# Patient Record
Sex: Male | Born: 1975 | Race: White | Hispanic: Yes | Marital: Married | Smoking: Smoker, current status unknown
Health system: Southern US, Community
[De-identification: ages and names within clinical notes are randomized; demographics above are authoritative.]

## PROBLEM LIST (undated history)

## (undated) DIAGNOSIS — K219 Gastro-esophageal reflux disease without esophagitis: Secondary | ICD-10-CM

## (undated) HISTORY — DX: Gastro-esophageal reflux disease without esophagitis: K21.9

---

## 2004-01-17 ENCOUNTER — Emergency Department (HOSPITAL_COMMUNITY): Admission: EM | Admit: 2004-01-17 | Discharge: 2004-01-17 | Payer: Self-pay | Admitting: Emergency Medicine

## 2007-01-10 ENCOUNTER — Ambulatory Visit: Payer: Self-pay | Admitting: Internal Medicine

## 2007-01-10 DIAGNOSIS — K5909 Other constipation: Secondary | ICD-10-CM

## 2007-01-10 DIAGNOSIS — K219 Gastro-esophageal reflux disease without esophagitis: Secondary | ICD-10-CM

## 2007-01-13 LAB — CONVERTED CEMR LAB
Basophils Absolute: 0 10*3/uL (ref 0.0–0.1)
Basophils Relative: 0 % (ref 0–1)
Cholesterol: 240 mg/dL — ABNORMAL HIGH (ref 0–200)
Eosinophils Absolute: 0.1 10*3/uL (ref 0.0–0.7)
MCHC: 34.4 g/dL (ref 30.0–36.0)
MCV: 79.3 fL (ref 78.0–100.0)
Neutro Abs: 3.1 10*3/uL (ref 1.7–7.7)
Neutrophils Relative %: 53 % (ref 43–77)
Platelets: 184 10*3/uL (ref 150–400)
Potassium: 4.1 meq/L (ref 3.5–5.3)
Sodium: 140 meq/L (ref 135–145)
TSH: 1.518 microintl units/mL (ref 0.350–5.50)

## 2007-01-14 ENCOUNTER — Telehealth (INDEPENDENT_AMBULATORY_CARE_PROVIDER_SITE_OTHER): Payer: Self-pay | Admitting: *Deleted

## 2007-02-05 ENCOUNTER — Ambulatory Visit: Payer: Self-pay | Admitting: Internal Medicine

## 2007-02-25 ENCOUNTER — Telehealth (INDEPENDENT_AMBULATORY_CARE_PROVIDER_SITE_OTHER): Payer: Self-pay | Admitting: *Deleted

## 2007-08-21 ENCOUNTER — Telehealth (INDEPENDENT_AMBULATORY_CARE_PROVIDER_SITE_OTHER): Payer: Self-pay | Admitting: *Deleted

## 2012-01-14 ENCOUNTER — Ambulatory Visit (INDEPENDENT_AMBULATORY_CARE_PROVIDER_SITE_OTHER): Payer: Self-pay | Admitting: Internal Medicine

## 2012-01-14 ENCOUNTER — Encounter: Payer: Self-pay | Admitting: Internal Medicine

## 2012-01-14 VITALS — BP 112/76 | HR 57 | Temp 98.6°F | Ht 66.0 in | Wt 175.6 lb

## 2012-01-14 DIAGNOSIS — Z0000 Encounter for general adult medical examination without abnormal findings: Secondary | ICD-10-CM

## 2012-01-14 NOTE — Assessment & Plan Note (Signed)
Td 2008 Declined flu shot We discussed diet, exercise, self testicular exam. Labs

## 2012-01-14 NOTE — Patient Instructions (Signed)
Please come back fasting: FLP, CMP, TSH, CBC-- dx V70

## 2012-01-14 NOTE — Progress Notes (Signed)
  Subjective:    Patient ID: Sean Wang, male    DOB: 1975/06/19, 36 y.o.   MRN: [id number]  HPI New patient, CPX  Past Medical History: GERD Chronic constipation  Past surgical history No  Family History: Father:living--stroke (4 y/o) Mother: living DM-- aunt CAD-- no prostate ca--no colon ca--no   Social History: Married, 2 son,  from [place] Tobacco -- ~1/6 ppd Drug use:  no Alcohol use:  Yes, socially Occupation-- car detail, painting  Diet-- regular  Exercise-- nothing regular but active at work  Review of Systems No chest pain or shortness of breath No nausea, vomiting, diarrhea. Occasionally has GERD. Has chronic constipation, currently well managed with fiber daily. No dysuria gross nocturia. No anxiety or depression. No headaches.    Objective:   Physical Exam General -- alert, well-developed, and well-nourished.   Neck --no thyromegaly  Lungs -- normal respiratory effort, no intercostal retractions, no accessory muscle use, and normal breath sounds.   Heart-- normal rate, regular rhythm, no murmur, and no gallop.   Abdomen--soft, non-tender, no distention, no masses, no HSM, no guarding, and no rigidity.   Extremities-- no pretibial edema bilaterally  Neurologic-- alert & oriented X3 and strength normal in all extremities. Psych-- Cognition and judgment appear intact. Alert and cooperative with normal attention span and concentration.  not anxious appearing and not depressed appearing.      Assessment & Plan:

## 2012-01-15 ENCOUNTER — Other Ambulatory Visit (INDEPENDENT_AMBULATORY_CARE_PROVIDER_SITE_OTHER): Payer: Self-pay

## 2012-01-15 DIAGNOSIS — Z0000 Encounter for general adult medical examination without abnormal findings: Secondary | ICD-10-CM

## 2012-01-15 LAB — CBC WITH DIFFERENTIAL/PLATELET
Eosinophils Relative: 1 % (ref 0.0–5.0)
HCT: 46.7 % (ref 39.0–52.0)
Lymphs Abs: 1.6 10*3/uL (ref 0.7–4.0)
Monocytes Relative: 8.2 % (ref 3.0–12.0)
Platelets: 216 10*3/uL (ref 150.0–400.0)
RBC: 5.79 Mil/uL (ref 4.22–5.81)
WBC: 4.2 10*3/uL — ABNORMAL LOW (ref 4.5–10.5)

## 2012-01-15 LAB — LIPID PANEL
Cholesterol: 253 mg/dL — ABNORMAL HIGH (ref 0–200)
HDL: 36.8 mg/dL — ABNORMAL LOW (ref >39.00)
Triglycerides: 160 mg/dL — ABNORMAL HIGH (ref 0.0–149.0)

## 2012-01-15 LAB — COMPREHENSIVE METABOLIC PANEL
Albumin: 4 g/dL (ref 3.5–5.2)
Alkaline Phosphatase: 52 U/L (ref 39–117)
BUN: 12 mg/dL (ref 6–23)
Creatinine, Ser: 0.8 mg/dL (ref 0.4–1.5)
Glucose, Bld: 87 mg/dL (ref 70–99)
Potassium: 4.2 mEq/L (ref 3.5–5.1)
Total Bilirubin: 0.6 mg/dL (ref 0.3–1.2)

## 2012-01-16 LAB — LDL CHOLESTEROL, DIRECT: Direct LDL: 187 mg/dL

## 2012-01-17 LAB — TSH: TSH: 1.66 u[IU]/mL (ref 0.35–5.50)

## 2012-05-10 ENCOUNTER — Other Ambulatory Visit: Payer: Self-pay

## 2013-01-29 ENCOUNTER — Other Ambulatory Visit: Payer: Self-pay

## 2014-01-08 ENCOUNTER — Other Ambulatory Visit: Payer: Self-pay

## 2018-09-12 ENCOUNTER — Emergency Department (HOSPITAL_COMMUNITY)
Admission: EM | Admit: 2018-09-12 | Discharge: 2018-09-12 | Disposition: A | Discharge status: Home / Self Care | Payer: Self-pay | Attending: Emergency Medicine | Admitting: Emergency Medicine

## 2018-09-12 ENCOUNTER — Other Ambulatory Visit: Payer: Self-pay

## 2018-09-12 ENCOUNTER — Emergency Department (HOSPITAL_COMMUNITY): Payer: Self-pay

## 2018-09-12 DIAGNOSIS — R109 Unspecified abdominal pain: Secondary | ICD-10-CM

## 2018-09-12 DIAGNOSIS — R9389 Abnormal findings on diagnostic imaging of other specified body structures: Secondary | ICD-10-CM

## 2018-09-12 DIAGNOSIS — F17210 Nicotine dependence, cigarettes, uncomplicated: Secondary | ICD-10-CM | POA: Insufficient documentation

## 2018-09-12 DIAGNOSIS — R1031 Right lower quadrant pain: Secondary | ICD-10-CM | POA: Insufficient documentation

## 2018-09-12 LAB — URINALYSIS, ROUTINE W REFLEX MICROSCOPIC
Bilirubin Urine: NEGATIVE
Glucose, UA: NEGATIVE mg/dL
Hgb urine dipstick: NEGATIVE
Ketones, ur: NEGATIVE mg/dL
Leukocytes,Ua: NEGATIVE
Nitrite: NEGATIVE
Protein, ur: NEGATIVE mg/dL
Specific Gravity, Urine: 1.004 — ABNORMAL LOW (ref 1.005–1.030)
pH: 8 (ref 5.0–8.0)

## 2018-09-12 LAB — CBC WITH DIFFERENTIAL/PLATELET
Abs Immature Granulocytes: 0.01 10*3/uL (ref 0.00–0.07)
Basophils Absolute: 0 10*3/uL (ref 0.0–0.1)
Basophils Relative: 0 %
Eosinophils Absolute: 0.1 10*3/uL (ref 0.0–0.5)
Eosinophils Relative: 1 %
HCT: 44.9 % (ref 39.0–52.0)
Hemoglobin: 14.8 g/dL (ref 13.0–17.0)
Immature Granulocytes: 0 %
Lymphocytes Relative: 37 %
Lymphs Abs: 1.8 10*3/uL (ref 0.7–4.0)
MCH: 27 pg (ref 26.0–34.0)
MCHC: 33 g/dL (ref 30.0–36.0)
MCV: 81.8 fL (ref 80.0–100.0)
Monocytes Absolute: 0.4 10*3/uL (ref 0.1–1.0)
Monocytes Relative: 8 %
Neutro Abs: 2.6 10*3/uL (ref 1.7–7.7)
Neutrophils Relative %: 54 %
Platelets: 213 10*3/uL (ref 150–400)
RBC: 5.49 MIL/uL (ref 4.22–5.81)
RDW: 11.7 % (ref 11.5–15.5)
WBC: 4.9 10*3/uL (ref 4.0–10.5)
nRBC: 0 % (ref 0.0–0.2)

## 2018-09-12 LAB — COMPREHENSIVE METABOLIC PANEL
ALT: 29 U/L (ref 0–44)
AST: 23 U/L (ref 15–41)
Albumin: 4.3 g/dL (ref 3.5–5.0)
Alkaline Phosphatase: 62 U/L (ref 38–126)
Anion gap: 9 (ref 5–15)
BUN: 9 mg/dL (ref 6–20)
CO2: 25 mmol/L (ref 22–32)
Calcium: 9.3 mg/dL (ref 8.9–10.3)
Chloride: 102 mmol/L (ref 98–111)
Creatinine, Ser: 0.85 mg/dL (ref 0.61–1.24)
GFR calc Af Amer: >60 mL/min (ref >60)
GFR calc non Af Amer: >60 mL/min (ref >60)
Glucose, Bld: 94 mg/dL (ref 70–99)
Potassium: 3.8 mmol/L (ref 3.5–5.1)
Sodium: 136 mmol/L (ref 135–145)
Total Bilirubin: 1 mg/dL (ref 0.3–1.2)
Total Protein: 7.1 g/dL (ref 6.5–8.1)

## 2018-09-12 LAB — LIPASE, BLOOD: Lipase: 39 U/L (ref 11–51)

## 2018-09-12 MED ORDER — ACETAMINOPHEN 500 MG PO TABS: 500.0000 mg | ORAL_TABLET | Freq: Four times a day (QID) | ORAL | 0 refills | Status: AC | PRN

## 2018-09-12 MED ORDER — NAPROXEN 500 MG PO TBEC: 500.0000 mg | DELAYED_RELEASE_TABLET | Freq: Two times a day (BID) | ORAL | 0 refills | Status: AC

## 2018-09-12 NOTE — ED Triage Notes (Signed)
2 months ago pain to  ri sde with rx of pantoprozole ordered.   Today states new onset with pain to Rl ateral chest and lower abd pain radiates.  No urinary trouble, N/ V/D

## 2018-09-12 NOTE — ED Provider Notes (Signed)
[facility] EMERGENCY DEPARTMENT Provider Note   CSN: [id number] Arrival date & time: 09/12/18  1505    History   Chief Complaint No chief complaint on file.   HPI Sean Wang is a 43 y.o. male presenting to the ED reporting 2 months of right flank pain that became unbearable earlier this morning.   Pt states the pains are "sharp and deep, like my insides".   States the pain is exacerbated when he is at work and lifting heavy objects and "sometimes it gets so bad I have to lie down".   Relieved with rest until today.   Pt states the pain radiates around to epigastric/periumbilical region.    Denies fever, cp, shob, cough/congestion, n/v/d, urinary symptoms, testicular pain.  He actually denies pain at this time.   He states he came today because the pain was much more severe and unrelieved by his usual rest.   He has pmh of GERD and takes pantoprazole.  No surgical history.  He does not appear to be in distress.     HPI  Past Medical History:  Diagnosis Date  . GERD (gastroesophageal reflux disease)     Patient Active Problem List   Diagnosis Date Noted  . Annual physical exam 01/14/2012  . GERD 01/10/2007  . CONSTIPATION, CHRONIC 01/10/2007    No past surgical history on file.      Home Medications    Prior to Admission medications   Medication Sig Start Date End Date Taking? Authorizing Provider  polycarbophil (FIBERCON) 625 MG tablet Take 625 mg by mouth daily.    [provider]    Family History Family History  Problem Relation Age of Onset  . Stroke Father 97  . Hyperlipidemia Father   . Hypertension Father   . Diabetes Maternal Aunt     Social History Social History   Tobacco Use  . Smoking status: Smoker, Current Status Unknown    Packs/day: 0.30    Years: 15.00    Pack years: 4.50    Types: Cigarettes  Substance Use Topics  . Alcohol use: Yes    Comment: OCC  . Drug use: No     Allergies   Patient has no  known allergies.   Review of Systems Review of Systems  Constitutional: Negative for appetite change, chills, fever and unexpected weight change.  Respiratory: Negative for chest tightness and shortness of breath.   Cardiovascular: Negative for chest pain.  Gastrointestinal: Positive for abdominal pain.  Genitourinary: Positive for flank pain. Negative for dysuria, hematuria and testicular pain.  Musculoskeletal: Positive for back pain.  Skin: Negative for color change and rash.  Neurological: Negative for dizziness and headaches.     Physical Exam Updated Vital Signs BP (!) 161/91 (BP Location: Right Arm)   Pulse 85   Temp 98.3 F (36.8 C) (Oral)   Resp 16   SpO2 98%   Physical Exam Vitals signs and nursing note reviewed.  Constitutional:      General: He is not in acute distress.    Appearance: Normal appearance. He is not diaphoretic.  HENT:     Head: Normocephalic and atraumatic.  Eyes:     General:        Right eye: No discharge.        Left eye: No discharge.  Cardiovascular:     Rate and Rhythm: Normal rate and regular rhythm.     Pulses: Normal pulses.     Heart sounds: Normal heart  sounds.  Pulmonary:     Effort: Pulmonary effort is normal. No respiratory distress.  Neurological:     Mental Status: He is alert.     Coordination: Coordination normal.  Psychiatric:        Mood and Affect: Mood normal.        Behavior: Behavior normal.      ED Treatments / Results  Labs (all labs ordered are listed, but only abnormal results are displayed) Labs Reviewed  COMPREHENSIVE METABOLIC PANEL  CBC WITH DIFFERENTIAL/PLATELET  LIPASE, BLOOD  URINALYSIS, ROUTINE W REFLEX MICROSCOPIC    EKG None  Radiology No results found.  Procedures Procedures (including critical care time)  Medications Ordered in ED Medications - No data to display   Initial Impression / Assessment and Plan / ED Course  I have reviewed the triage vital signs and the nursing  notes.  Pertinent labs & imaging results that were available during my care of the patient were reviewed by me and considered in my medical decision making (see chart for details).  Care endorsed to Hart RobinsonsMia Fawze, PA-C pending labs/imaging.  Final Clinical Impressions(s) / ED Diagnoses   Final diagnoses:  Flank pain    ED Discharge Orders    None       Pruitt Taboada K, PA-C 09/12/18 1603    Charlynne PanderYao, David Hsienta, MD 09/12/18 [phone]

## 2018-09-12 NOTE — Discharge Instructions (Addendum)
Start taking Naprosyn twice daily with food.  Continue taking pantoprazole as well to help with upset stomach issues.  You can also take it with Pepcid.  If your pain worsens in between your doses of Naprosyn, you can take 500 to 1000 mg of Tylenol.  Apply ice or heat, whichever feels best, 20 minutes at a time and do some gentle stretching exercises to avoid muscle stiffness.  Follow-up with your primary care physician for reevaluation of your symptoms.  I have attached resources for follow-up with a primary care doctor in the area.  You can call primary care at [facility] or [facility] wellness and tell them you were referred from the emergency department.  Your CT scan showed a small abnormality and the radiologist recommends obtaining a contrast-enhanced CT scan for further evaluation.  Your primary care doctor can order this.  Return to the emergency department if any concerning signs or symptoms develop such as persistent vomiting, high fevers, severe pain, weakness of the legs, difficulty walking, or passing out.

## 2018-09-12 NOTE — ED Provider Notes (Signed)
Received patient at signout from [facility].  Refer to provider note for full history and physical examination.  Briefly, patient is a 43 year old male presenting with history of right flank pain intermittently for the past 2 months, worse today.  No urinary symptoms.  He has not had any symptoms while he has been here and the pain is not reproducible on examination today.  Pending lab work and CT renal stone study.   Physical Exam  BP (!) 161/91 (BP Location: Right Arm)   Pulse 85   Temp 98.3 F (36.8 C) (Oral)   Resp 16   SpO2 98%   Physical Exam Vitals signs and nursing note reviewed.  Constitutional:      General: He is not in acute distress.    Appearance: He is well-developed.     Comments: Resting comfortably in bed  HENT:     Head: Normocephalic and atraumatic.  Eyes:     General:        Right eye: No discharge.        Left eye: No discharge.     Conjunctiva/sclera: Conjunctivae normal.  Neck:     Vascular: No JVD.     Trachea: No tracheal deviation.  Cardiovascular:     Rate and Rhythm: Normal rate.  Pulmonary:     Effort: Pulmonary effort is normal.  Abdominal:     General: Abdomen is flat. There is no distension.     Palpations: Abdomen is soft.     Tenderness: There is no abdominal tenderness. There is no right CVA tenderness, left CVA tenderness, guarding or rebound.  Musculoskeletal:     Comments: No midline lumbar spine TTP, no paralumbar muscle tenderness, no deformity, crepitus, or step-off noted   Skin:    General: Skin is warm and dry.     Findings: No erythema.  Neurological:     Mental Status: He is alert.  Psychiatric:        Behavior: Behavior normal.       MDM  CLINICAL DATA:  Bilateral flank pain  EXAM: CT ABDOMEN AND PELVIS WITHOUT CONTRAST  TECHNIQUE: Multidetector CT imaging of the abdomen and pelvis was performed following the standard protocol without IV contrast.  COMPARISON:  None.  FINDINGS: Lower chest: Lung bases  demonstrate no acute consolidation or effusion. The heart size is normal.  Hepatobiliary: No focal liver abnormality is seen. No gallstones, gallbladder wall thickening, or biliary dilatation.  Pancreas: Unremarkable. No pancreatic ductal dilatation or surrounding inflammatory changes.  Spleen: Normal in size without focal abnormality.  Adrenals/Urinary Tract: Adrenal glands are normal. No hydronephrosis. No ureteral stone. Mild bladder distention.  Stomach/Bowel: Stomach is within normal limits. Appendix appears normal. No evidence of bowel wall thickening, distention, or inflammatory changes.  Vascular/Lymphatic: Nonaneurysmal aorta. Mild aortic atherosclerosis. No significantly enlarged lymph nodes.  Reproductive: Slightly enlarged prostate  Other: Chunky calcification measuring 2.1 cm in the left upper abdomen. No free air or free fluid. Clips in the scrotum. Small left fat containing inguinal hernia.  Musculoskeletal: No acute or significant osseous findings.  IMPRESSION: 1. Negative for hydronephrosis or ureteral stone. No CT evidence for acute intra-abdominal or pelvic abnormality. 2. 2.1 cm chunky calcification in the left upper abdomen, either representing a small calcified mass or possibly an aneurysm. Nonemergent contrast-enhanced CT is recommended for further evaluation.   Electronically Signed   By: Donavan Foil M.D.   On: 09/12/2018 16:53   Patient resting comfortably no apparent distress.  His pain is not  reproducible on palpation he actually denies any pain at this time.  I informed him of his CT scan findings including the 2.1 cm chunky calcification in the left upper abdomen.  We discussed the recommendation for a nonemergent contrast-enhanced CT for further evaluation.  No concern for AAA as his aorta was within normal limits on imaging today.  We discussed management of his symptoms with anti-inflammatories, Tylenol, core exercises and  gentle stretching.  He was given resources for follow-up with a PCP.  Discussed strict ED return precautions. Pt verbalized understanding of and agreement with plan and is safe for discharge home at this time.       Jeanie SewerFawze, Lindzy Rupert A, PA-C 09/12/18 2019    Charlynne PanderYao, David Hsienta, MD 09/14/18 2029

## 2018-09-12 NOTE — ED Notes (Signed)
Pt verbalized understanding of discharge instructions and follow up. Pt ambulatory to lobby with steady gait.

## 2018-09-12 NOTE — ED Notes (Signed)
Patient transported to CT 

## 2020-09-04 IMAGING — CT CT RENAL STONE PROTOCOL
2 of 4 series · 16 of 46 positions shown, 18 images · non-contrast
Comparison: None.

CLINICAL DATA: Bilateral flank pain

EXAM:
CT ABDOMEN AND PELVIS WITHOUT CONTRAST
TECHNIQUE: Multidetector CT imaging of the abdomen and pelvis was performed
following the standard protocol without IV contrast.

[Series 3: ap without · axial · non-contrast · 0.78mm/px · z∈[+804,+1188]mm · 13 of 89 slices shown, 15 images]
[im 6/89  soft-tissue]
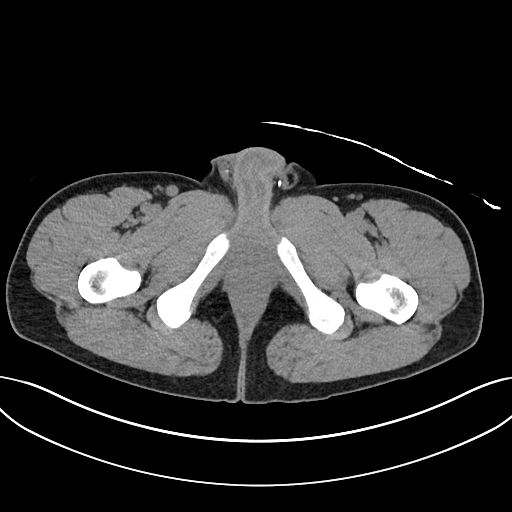
[im 6/89  bone]
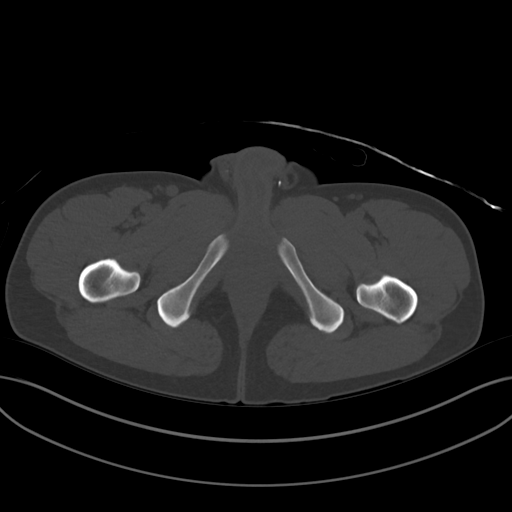
[im 11/89  soft-tissue]
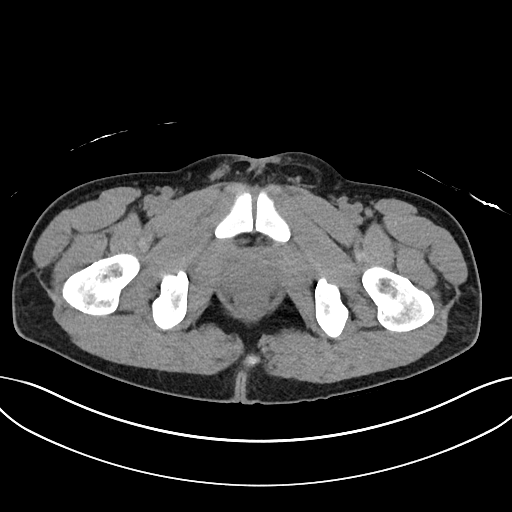
[im 21/89  soft-tissue]
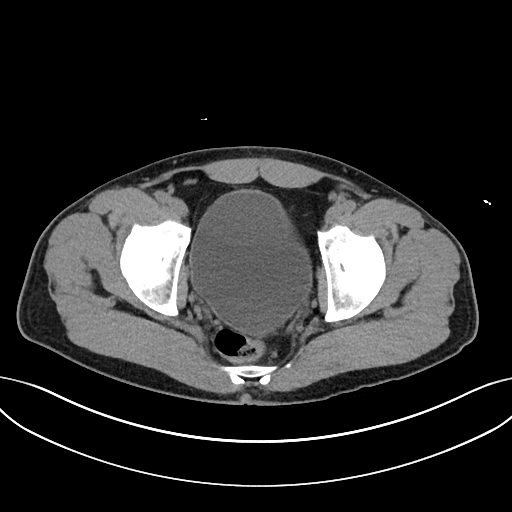
[im 26/89  soft-tissue]
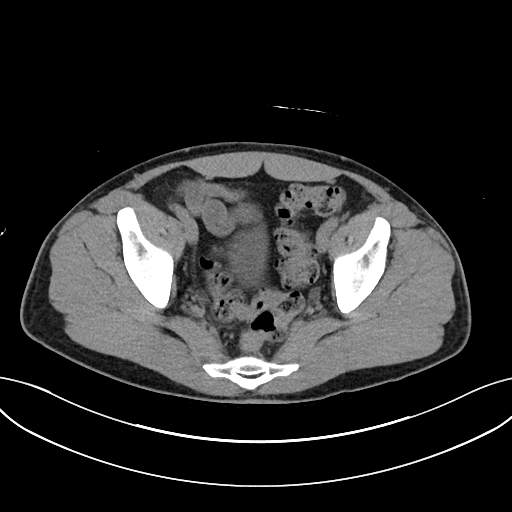
[im 32/89  soft-tissue]
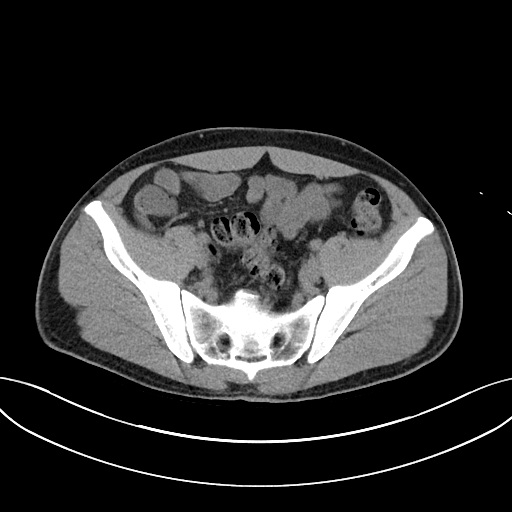
[im 37/89  soft-tissue]
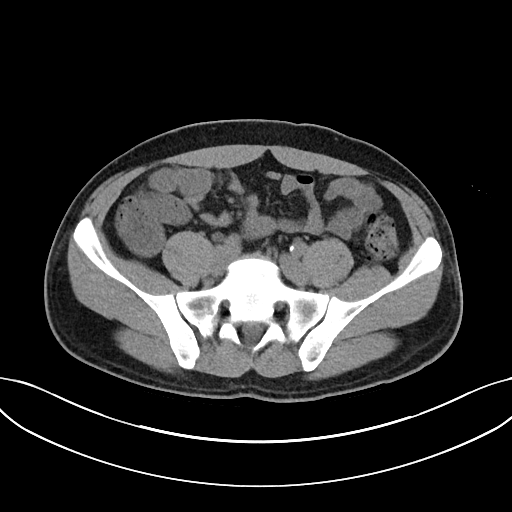
[im 47/89  soft-tissue]
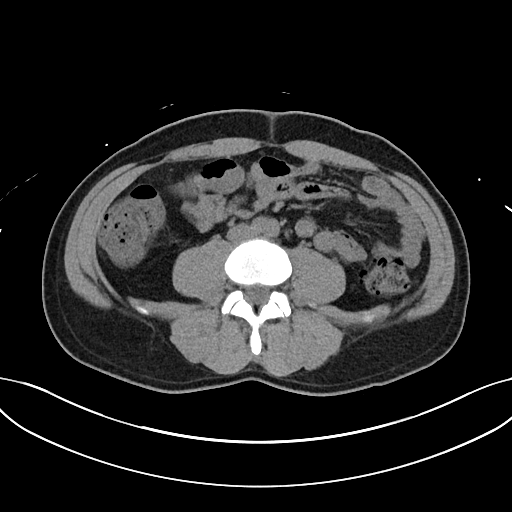
[im 52/89  soft-tissue]
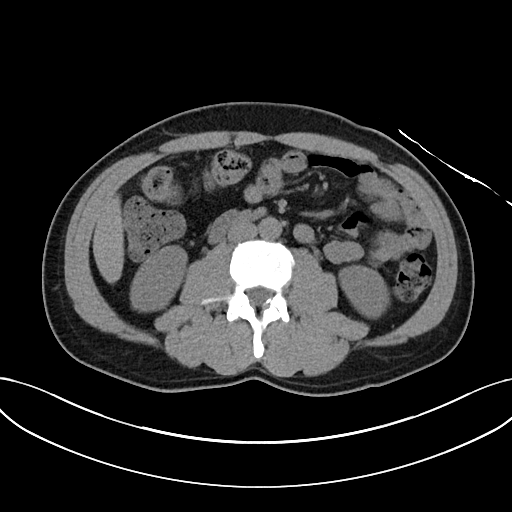
[im 57/89  soft-tissue]
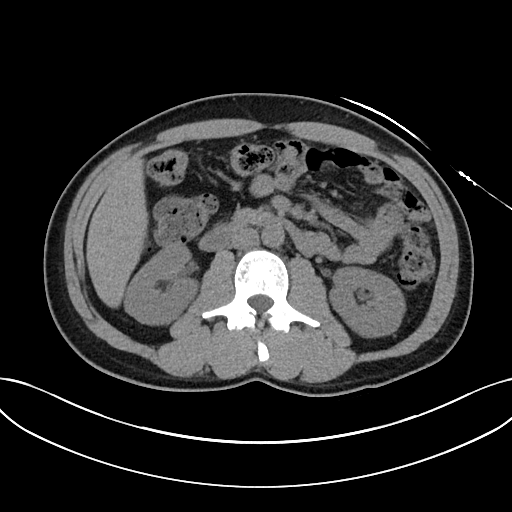
[im 57/89  bone]
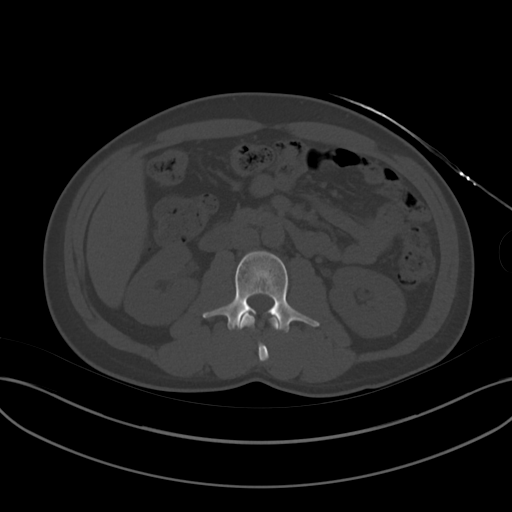
[im 63/89  soft-tissue]
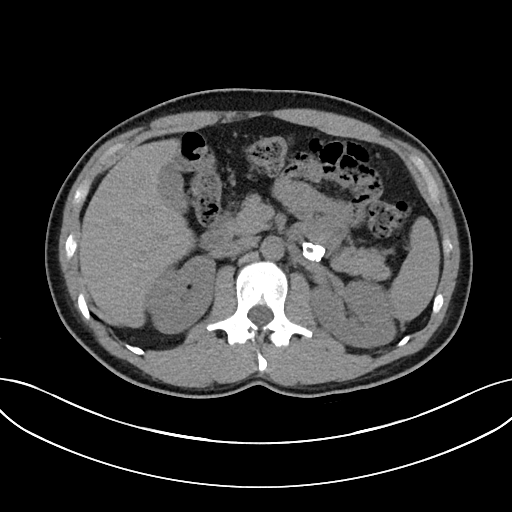
[im 68/89  soft-tissue]
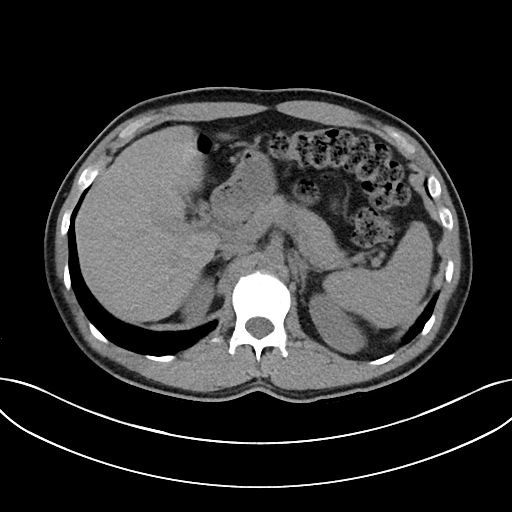
[im 78/89  soft-tissue]
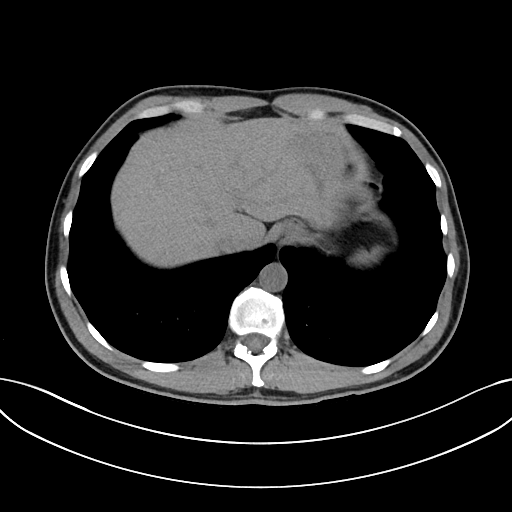
[im 83/89  soft-tissue]
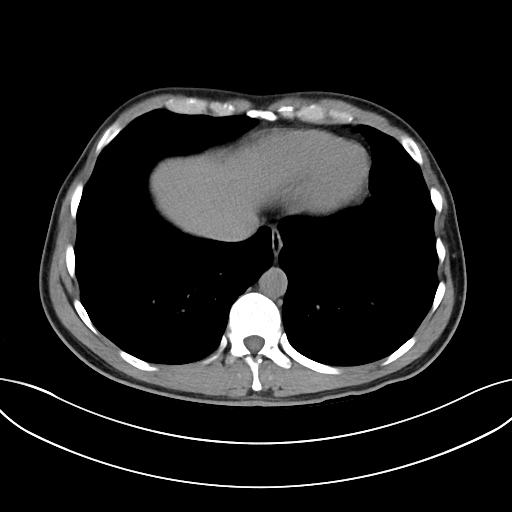

[Series 6: cor · coronal · 0.76mm/px · 3 of 93 slices shown]
[im 31/93  soft-tissue]
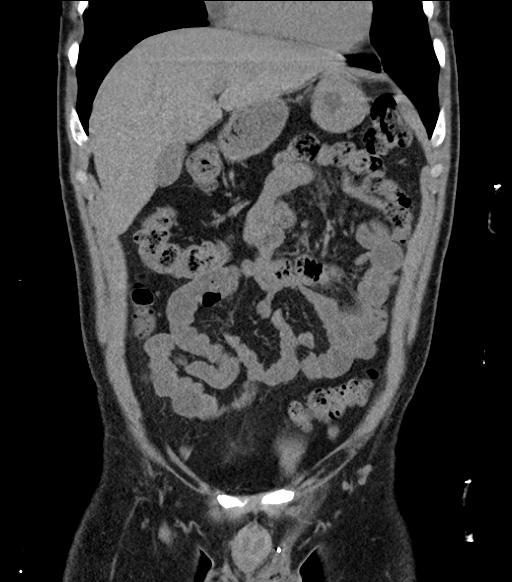
[im 41/93  soft-tissue]
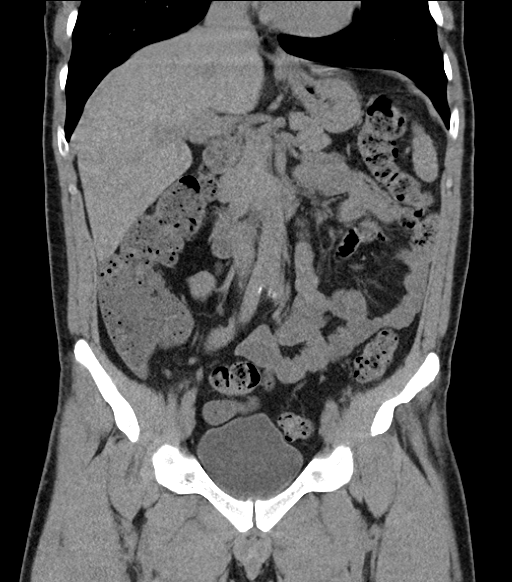
[im 52/93  soft-tissue]
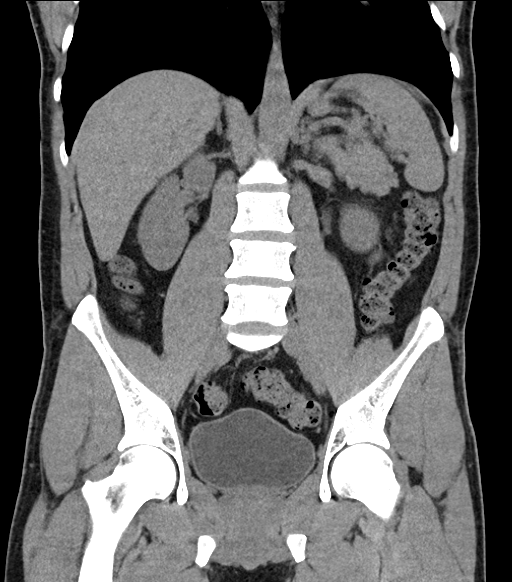

[16 of 46 positions shown; findings below may reference images not displayed]

FINDINGS: Lower chest: Lung bases demonstrate no acute consolidation or
effusion. The heart size is normal.

Hepatobiliary: No focal liver abnormality is seen. No gallstones,
gallbladder wall thickening, or biliary dilatation.

Pancreas: Unremarkable. No pancreatic ductal dilatation or
surrounding inflammatory changes.

Spleen: Normal in size without focal abnormality.

Adrenals/Urinary Tract: Adrenal glands are normal. No
hydronephrosis. No ureteral stone. Mild bladder distention.

Stomach/Bowel: Stomach is within normal limits. Appendix appears
normal. No evidence of bowel wall thickening, distention, or
inflammatory changes.

Vascular/Lymphatic: Nonaneurysmal aorta. Mild aortic
atherosclerosis. No significantly enlarged lymph nodes.

Reproductive: Slightly enlarged prostate

Other: Chunky calcification measuring 2.1 cm in the left upper
abdomen. No free air or free fluid. Clips in the scrotum. Small left
fat containing inguinal hernia.

Musculoskeletal: No acute or significant osseous findings.
IMPRESSION: 1. Negative for hydronephrosis or ureteral stone. No CT evidence for
acute intra-abdominal or pelvic abnormality.
2. 2.1 cm chunky calcification in the left upper abdomen, either
representing a small calcified mass or possibly an aneurysm.
Nonemergent contrast-enhanced CT is recommended for further
evaluation.
